# Patient Record
Sex: Male | Born: 1970
Health system: Southern US, Community
[De-identification: ages and names within clinical notes are randomized; demographics above are authoritative.]

## PROBLEM LIST (undated history)

## (undated) DIAGNOSIS — L409 Psoriasis, unspecified: Secondary | ICD-10-CM

## (undated) DIAGNOSIS — E785 Hyperlipidemia, unspecified: Secondary | ICD-10-CM

## (undated) DIAGNOSIS — E781 Pure hyperglyceridemia: Secondary | ICD-10-CM

## (undated) DIAGNOSIS — M109 Gout, unspecified: Secondary | ICD-10-CM

## (undated) DIAGNOSIS — N200 Calculus of kidney: Secondary | ICD-10-CM

## (undated) DIAGNOSIS — R55 Syncope and collapse: Secondary | ICD-10-CM

## (undated) HISTORY — DX: Pure hyperglyceridemia: E78.1

## (undated) HISTORY — DX: Calculus of kidney: N20.0

## (undated) HISTORY — DX: Gout, unspecified: M10.9

## (undated) HISTORY — PX: KIDNEY STONE SURGERY: SHX686

## (undated) HISTORY — DX: Hyperlipidemia, unspecified: E78.5

## (undated) HISTORY — PX: EYE SURGERY: SHX253

## (undated) HISTORY — PX: LITHOTRIPSY: SUR834

---

## 2014-09-09 DIAGNOSIS — D563 Thalassemia minor: Secondary | ICD-10-CM | POA: Insufficient documentation

## 2014-09-09 DIAGNOSIS — Z0000 Encounter for general adult medical examination without abnormal findings: Secondary | ICD-10-CM | POA: Insufficient documentation

## 2015-02-15 ENCOUNTER — Emergency Department (HOSPITAL_COMMUNITY)
Admission: EM | Admit: 2015-02-15 | Discharge: 2015-02-16 | Disposition: A | Discharge status: Home / Self Care | Payer: BLUE CROSS/BLUE SHIELD | Attending: Emergency Medicine | Admitting: Emergency Medicine

## 2015-02-15 ENCOUNTER — Emergency Department (HOSPITAL_COMMUNITY): Payer: BLUE CROSS/BLUE SHIELD

## 2015-02-15 ENCOUNTER — Encounter (HOSPITAL_COMMUNITY): Payer: Self-pay

## 2015-02-15 DIAGNOSIS — Y9289 Other specified places as the place of occurrence of the external cause: Secondary | ICD-10-CM | POA: Insufficient documentation

## 2015-02-15 DIAGNOSIS — Y999 Unspecified external cause status: Secondary | ICD-10-CM | POA: Diagnosis not present

## 2015-02-15 DIAGNOSIS — S01112A Laceration without foreign body of left eyelid and periocular area, initial encounter: Secondary | ICD-10-CM | POA: Diagnosis not present

## 2015-02-15 DIAGNOSIS — R55 Syncope and collapse: Secondary | ICD-10-CM | POA: Diagnosis not present

## 2015-02-15 DIAGNOSIS — W108XXA Fall (on) (from) other stairs and steps, initial encounter: Secondary | ICD-10-CM | POA: Insufficient documentation

## 2015-02-15 DIAGNOSIS — Y939 Activity, unspecified: Secondary | ICD-10-CM | POA: Insufficient documentation

## 2015-02-15 HISTORY — DX: Syncope and collapse: R55

## 2015-02-15 LAB — CBG MONITORING, ED: Glucose-Capillary: 120 mg/dL — ABNORMAL HIGH (ref 70–99)

## 2015-02-15 NOTE — ED Notes (Signed)
Pt arrived by EMS from home after syncopal episode.  Pt was climbing stairs when LOC resulting in fall.  Family states pt was out for about 5 minutes, pt has history of syncope.  Arrived from EMS wearing C-collar, no c/o pain.  Laceration left eyebrow.

## 2015-02-15 NOTE — ED Provider Notes (Signed)
CSN: [id number]     Arrival date & time 02/15/15  2254 History   First MD Initiated Contact with Patient 02/15/15 2256     Chief Complaint  Patient presents with  . Loss of Consciousness  . Fall     (Consider location/radiation/quality/duration/timing/severity/associated sxs/prior Treatment) Patient is a 44 y.o. male presenting with syncope and fall. The history is provided by the patient. No language interpreter was used.  Loss of Consciousness Fall Pertinent negatives include no coughing or numbness.  Oscar Larson is a 44 y.o [place] male who has a history of syncope and presents today for sudden onset syncope while on the wooden stairs in his home.  He states he fell down the stairs, hitting his head on the wooden banister.  He is unsure how many stairs he fell down since he lost consciousness. His family reports that he was unconscious for 5 minutes. This happened to him 2 years ago twice after working out at [employer] and he had a stress test and an echocardiogram done at [facility] in [place] but states everything was normal. He was dizzy before he passed out previously but today he didn't feel dizzy.  He states today is the first time he exerted himself by mowing the entire lawn and it is hilly. He denies any fever, shortness of breath, chest pain, abdominal pain, or extremity pain.   Past Medical History  Diagnosis Date  . Syncope    Past Surgical History  Procedure Laterality Date  . Eye surgery     History reviewed. No pertinent family history. History  Substance Use Topics  . Smoking status: Never Smoker   . Smokeless tobacco: Never Used  . Alcohol Use: No    Review of Systems  Eyes: Negative for visual disturbance.  Respiratory: Negative for cough.   Cardiovascular: Positive for syncope.  Gastrointestinal: Negative for abdominal distention.  Musculoskeletal: Negative for gait problem.  Neurological: Negative for speech difficulty and numbness.  All other  systems reviewed and are negative.     Allergies  Review of patient's allergies indicates no known allergies.  Home Medications   Prior to Admission medications   Not on File   BP 101/53 mmHg  Pulse 80  Temp(Src) 97.6 F (36.4 C) (Oral)  Resp 20  Ht 5\' 7"  (1.702 m)  Wt 193 lb (87.544 kg)  BMI 30.22 kg/m2  SpO2 99% Physical Exam  Constitutional: He is oriented to person, place, and time. He appears well-developed and well-nourished.  HENT:  Head:    2cm laceration through the left eyebrow and 1cm circumferential abraision to the right forehead.   Eyes: Conjunctivae, EOM and lids are normal. Pupils are equal, round, and reactive to light.  Neck: Normal range of motion. Neck supple.  Cardiovascular: Normal rate, regular rhythm and normal heart sounds.   Pulmonary/Chest: Effort normal and breath sounds normal.  Abdominal: Soft. There is no tenderness.  Musculoskeletal: Normal range of motion.  Neurological: He is alert and oriented to person, place, and time. He has normal strength. No cranial nerve deficit or sensory deficit.  Skin: Skin is warm and dry.  Nursing note and vitals reviewed.   ED Course  LACERATION REPAIR Date/Time: 02/16/2015 12:45 AM Performed by: Catha Gosselin Authorized by: Catha Gosselin Consent: Verbal consent obtained. Risks and benefits: risks, benefits and alternatives were discussed Consent given by: patient Patient understanding: patient states understanding of the procedure being performed Imaging studies: imaging studies available Patient identity confirmed: verbally with patient Body  area: head/neck Location details: left eyebrow Laceration length: 2 cm Foreign bodies: no foreign bodies Tendon involvement: none Nerve involvement: none Vascular damage: no Anesthesia: local infiltration Local anesthetic: lidocaine 2% with epinephrine Anesthetic total: 2 ml Patient sedated: no Preparation: Patient was prepped and draped in  the usual sterile fashion. Irrigation solution: saline Irrigation method: syringe Amount of cleaning: standard Debridement: none Degree of undermining: none Skin closure: 6-0 Prolene Number of sutures: 4 Technique: simple Approximation: close Approximation difficulty: simple Dressing: antibiotic ointment Patient tolerance: Patient tolerated the procedure well with no immediate complications   (including critical care time) Labs Review Labs Reviewed  CBC - Abnormal; Notable for the following:    WBC 16.8 (*)    RBC 6.65 (*)    Hemoglobin 12.9 (*)    MCV 60.0 (*)    MCH 19.4 (*)    RDW 15.6 (*)    All other components within normal limits  BASIC METABOLIC PANEL - Abnormal; Notable for the following:    Potassium 3.4 (*)    Glucose, Bld 118 (*)    Creatinine, Ser 1.37 (*)    GFR calc non Af Amer 62 (*)    GFR calc Af Amer 72 (*)    All other components within normal limits  URINALYSIS, ROUTINE W REFLEX MICROSCOPIC - Abnormal; Notable for the following:    Hgb urine dipstick TRACE (*)    All other components within normal limits  URINE MICROSCOPIC-ADD ON - Abnormal; Notable for the following:    Bacteria, UA FEW (*)    All other components within normal limits  CBG MONITORING, ED - Abnormal; Notable for the following:    Glucose-Capillary 120 (*)    All other components within normal limits  CBG MONITORING, ED  Rosezena Sensor, ED    Imaging Review Dg Chest 2 View  02/16/2015   CLINICAL DATA:  Fall, loss of consciousness.  EXAM: CHEST  2 VIEW  COMPARISON:  None.  FINDINGS: The cardiomediastinal contours are normal. Mild eventration of right hemidiaphragm. Pulmonary vasculature is normal. No consolidation, pleural effusion, or pneumothorax. No acute osseous abnormalities are seen.  IMPRESSION: No acute pulmonary process.   Electronically Signed   By: Rubye Oaks M.D.   On: 02/16/2015 00:55   Ct Head Wo Contrast  02/16/2015   CLINICAL DATA:  Syncopal episode while  climbing stairs at home, fall. Loss of consciousness for 5 minutes. LEFT eyebrow laceration.  EXAM: CT HEAD WITHOUT CONTRAST  CT CERVICAL SPINE WITHOUT CONTRAST  TECHNIQUE: Multidetector CT imaging of the head and cervical spine was performed following the standard protocol without intravenous contrast. Multiplanar CT image reconstructions of the cervical spine were also generated.  COMPARISON:  None.  FINDINGS: CT HEAD FINDINGS  The ventricles and sulci are normal. No intraparenchymal hemorrhage, mass effect nor midline shift. No acute large vascular territory infarcts. Very mild white matter changes suspected.  No abnormal extra-axial fluid collections. Basal cisterns are patent.  Focal LEFT periorbital soft tissue swelling without acute fracture. No skull fracture. The included ocular globes and orbital contents are non-suspicious. Bilateral parent ocular lens implants. Paranasal sinus mucosal thickening without air-fluid levels. The mastoid air cells are well aerated.  CT CERVICAL SPINE FINDINGS  Cervical vertebral bodies and posterior elements are intact and aligned with straightened cervical lordosis. Intervertebral disc heights preserved. No destructive bony lesions. C1-2 articulation maintained. Included prevertebral and paraspinal soft tissues are unremarkable.  Degenerative change of the cervical spine results in moderate to severe LEFT C4-5  neural foraminal narrowing.  IMPRESSION: CT HEAD: Focal LEFT periorbital soft tissue swelling without postseptal hematoma nor acute intracranial process.  Mild nonspecific white matter changes.  CT CERVICAL SPINE: Straightened cervical lordosis without acute fracture nor malalignment.   Electronically Signed   By: Awilda Metro   On: 02/16/2015 01:27   Ct Cervical Spine Wo Contrast  02/16/2015   CLINICAL DATA:  Syncopal episode while climbing stairs at home, fall. Loss of consciousness for 5 minutes. LEFT eyebrow laceration.  EXAM: CT HEAD WITHOUT CONTRAST  CT  CERVICAL SPINE WITHOUT CONTRAST  TECHNIQUE: Multidetector CT imaging of the head and cervical spine was performed following the standard protocol without intravenous contrast. Multiplanar CT image reconstructions of the cervical spine were also generated.  COMPARISON:  None.  FINDINGS: CT HEAD FINDINGS  The ventricles and sulci are normal. No intraparenchymal hemorrhage, mass effect nor midline shift. No acute large vascular territory infarcts. Very mild white matter changes suspected.  No abnormal extra-axial fluid collections. Basal cisterns are patent.  Focal LEFT periorbital soft tissue swelling without acute fracture. No skull fracture. The included ocular globes and orbital contents are non-suspicious. Bilateral parent ocular lens implants. Paranasal sinus mucosal thickening without air-fluid levels. The mastoid air cells are well aerated.  CT CERVICAL SPINE FINDINGS  Cervical vertebral bodies and posterior elements are intact and aligned with straightened cervical lordosis. Intervertebral disc heights preserved. No destructive bony lesions. C1-2 articulation maintained. Included prevertebral and paraspinal soft tissues are unremarkable.  Degenerative change of the cervical spine results in moderate to severe LEFT C4-5 neural foraminal narrowing.  IMPRESSION: CT HEAD: Focal LEFT periorbital soft tissue swelling without postseptal hematoma nor acute intracranial process.  Mild nonspecific white matter changes.  CT CERVICAL SPINE: Straightened cervical lordosis without acute fracture nor malalignment.   Electronically Signed   By: Awilda Metro   On: 02/16/2015 01:27     EKG Interpretation   Date/Time:  Monday February 15 2015 22:58:32 EDT Ventricular Rate:  77 PR Interval:  167 QRS Duration: 91 QT Interval:  367 QTC Calculation: 415 R Axis:   41 Text Interpretation:  Sinus rhythm Low voltage, precordial leads RSR' in  V1 or V2, right VCD or RVH No old tracing to compare Confirmed by Mirian Mo [phone]) on 02/15/2015 11:14:10 PM      MDM   Final diagnoses:  Syncope, unspecified syncope type   Patient presents for sudden onset syncope and fall down the stairs.  He had 4-5 minutes of loc and a laceration above the right eyebrow.  He had a similar event twice two years ago.  He had a stress test and echocardiogram at [facility] in [place] which were both normal two years ago.   He is back to baseline with no vision changes, no dizziness, no chest pain, no shortness of breath, no abdominal pain, no nausea, no vomiting.  His CT head only soft tissue swelling without postseptal hematoma or intracranial process.  His CT c-spine shows no acute fracture or malalignment.  His CXR shows no pneumonia, no pleural effusion or pneumothrax.  He has no UTI.  I discussed labs and low hgb and he is aware of this and stated he has thalassemia.  I have given him f/u using the resource guide.  He agrees with the plan and is comfortable going home. He is to have the 4 sutures removed from the left eyebrow in 5 days.         Catha Gosselin, PA-C  02/16/15 1617  Mirian MoMatthew Gentry, MD 02/17/15 [id number]

## 2015-02-16 ENCOUNTER — Emergency Department (HOSPITAL_COMMUNITY): Payer: BLUE CROSS/BLUE SHIELD

## 2015-02-16 LAB — BASIC METABOLIC PANEL
ANION GAP: 7 (ref 5–15)
BUN: 16 mg/dL (ref 6–23)
CHLORIDE: 101 mmol/L (ref 96–112)
CO2: 29 mmol/L (ref 19–32)
CREATININE: 1.37 mg/dL — AB (ref 0.50–1.35)
Calcium: 9 mg/dL (ref 8.4–10.5)
GFR calc non Af Amer: 62 mL/min — ABNORMAL LOW (ref >90)
GFR, EST AFRICAN AMERICAN: 72 mL/min — AB (ref >90)
GLUCOSE: 118 mg/dL — AB (ref 70–99)
Potassium: 3.4 mmol/L — ABNORMAL LOW (ref 3.5–5.1)
SODIUM: 137 mmol/L (ref 135–145)

## 2015-02-16 LAB — CBC
HCT: 39.9 % (ref 39.0–52.0)
HEMOGLOBIN: 12.9 g/dL — AB (ref 13.0–17.0)
MCH: 19.4 pg — ABNORMAL LOW (ref 26.0–34.0)
MCHC: 32.3 g/dL (ref 30.0–36.0)
MCV: 60 fL — ABNORMAL LOW (ref 78.0–100.0)
Platelets: 221 10*3/uL (ref 150–400)
RBC: 6.65 MIL/uL — AB (ref 4.22–5.81)
RDW: 15.6 % — ABNORMAL HIGH (ref 11.5–15.5)
WBC: 16.8 10*3/uL — AB (ref 4.0–10.5)

## 2015-02-16 LAB — URINALYSIS, ROUTINE W REFLEX MICROSCOPIC
Bilirubin Urine: NEGATIVE
Glucose, UA: NEGATIVE mg/dL
Ketones, ur: NEGATIVE mg/dL
LEUKOCYTES UA: NEGATIVE
NITRITE: NEGATIVE
Protein, ur: NEGATIVE mg/dL
SPECIFIC GRAVITY, URINE: 1.016 (ref 1.005–1.030)
Urobilinogen, UA: 0.2 mg/dL (ref 0.0–1.0)
pH: 6 (ref 5.0–8.0)

## 2015-02-16 LAB — URINE MICROSCOPIC-ADD ON

## 2015-02-16 LAB — I-STAT TROPONIN, ED: TROPONIN I, POC: 0 ng/mL (ref 0.00–0.08)

## 2015-02-16 MED ORDER — LIDOCAINE-EPINEPHRINE (PF) 2 %-1:200000 IJ SOLN
10.0000 mL | Freq: Once | INTRAMUSCULAR | Status: AC
  Administered 2015-02-16: 10 mL via INTRADERMAL
  Filled 2015-02-16: qty 20

## 2015-02-16 NOTE — Discharge Instructions (Signed)
Syncope °Syncope is a medical term for fainting or passing out. This means you lose consciousness and drop to the ground. People are generally unconscious for less than 5 minutes. You may have some muscle twitches for up to 15 seconds before waking up and returning to normal. Syncope occurs more often in older adults, but it can happen to anyone. While most causes of syncope are not dangerous, syncope can be a sign of a serious medical problem. It is important to seek medical care.  °CAUSES  °Syncope is caused by a sudden drop in blood flow to the brain. The specific cause is often not determined. Factors that can bring on syncope include: °· Taking medicines that lower blood pressure. °· Sudden changes in posture, such as standing up quickly. °· Taking more medicine than prescribed. °· Standing in one place for too long. °· Seizure disorders. °· Dehydration and excessive exposure to heat. °· Low blood sugar (hypoglycemia). °· Straining to have a bowel movement. °· Heart disease, irregular heartbeat, or other circulatory problems. °· Fear, emotional distress, seeing blood, or severe pain. °SYMPTOMS  °Right before fainting, you may: °· Feel dizzy or light-headed. °· Feel nauseous. °· See all white or all black in your field of vision. °· Have cold, clammy skin. °DIAGNOSIS  °Your health care provider will ask about your symptoms, perform a physical exam, and perform an electrocardiogram (ECG) to record the electrical activity of your heart. Your health care provider may also perform other heart or blood tests to determine the cause of your syncope which may include: °· Transthoracic echocardiogram (TTE). During echocardiography, sound waves are used to evaluate how blood flows through your heart. °· Transesophageal echocardiogram (TEE). °· Cardiac monitoring. This allows your health care provider to monitor your heart rate and rhythm in real time. °· Holter monitor. This is a portable device that records your  heartbeat and can help diagnose heart arrhythmias. It allows your health care provider to track your heart activity for several days, if needed. °· Stress tests by exercise or by giving medicine that makes the heart beat faster. °TREATMENT  °In most cases, no treatment is needed. Depending on the cause of your syncope, your health care provider may recommend changing or stopping some of your medicines. °HOME CARE INSTRUCTIONS °· Have someone stay with you until you feel stable. °· Do not drive, use machinery, or play sports until your health care provider says it is okay. °· Keep all follow-up appointments as directed by your health care provider. °· Lie down right away if you start feeling like you might faint. Breathe deeply and steadily. Wait until all the symptoms have passed. °· Drink enough fluids to keep your urine clear or pale yellow. °· If you are taking blood pressure or heart medicine, get up slowly and take several minutes to sit and then stand. This can reduce dizziness. °SEEK IMMEDIATE MEDICAL CARE IF:  °· You have a severe headache. °· You have unusual pain in the chest, abdomen, or back. °· You are bleeding from your mouth or rectum, or you have black or tarry stool. °· You have an irregular or very fast heartbeat. °· You have pain with breathing. °· You have repeated fainting or seizure-like jerking during an episode. °· You faint when sitting or lying down. °· You have confusion. °· You have trouble walking. °· You have severe weakness. °· You have vision problems. °If you fainted, call your local emergency services (911 in [place] Do not drive   yourself to the hospital.  °MAKE SURE YOU: °· Understand these instructions. °· Will watch your condition. °· Will get help right away if you are not doing well or get worse. °Document Released: 11/06/2005 Document Revised: 11/11/2013 Document Reviewed: 01/05/2012 °ExitCare® Patient Information ©2015 [employer]. This information is not intended to replace  advice given to you by your health care provider. Make sure you discuss any questions you have with your health care provider. ° °Emergency Department Resource Guide °1) Find a Doctor and Pay Out of Pocket °Although you won't have to find out who is covered by your insurance plan, it is a good idea to ask around and get recommendations. You will then need to call the office and see if the doctor you have chosen will accept you as a new patient and what types of options they offer for patients who are self-pay. Some doctors offer discounts or will set up payment plans for their patients who do not have insurance, but you will need to ask so you aren't surprised when you get to your appointment. ° °2) Contact Your Local Health Department °Not all health departments have doctors that can see patients for sick visits, but many do, so it is worth a call to see if yours does. If you don't know where your local health department is, you can check in your phone book. The CDC also has a tool to help you locate your state's health department, and many state websites also have listings of all of their local health departments. ° °3) Find a Walk-in Clinic °If your illness is not likely to be very severe or complicated, you may want to try a walk in clinic. These are popping up all over the country in pharmacies, drugstores, and shopping centers. They're usually staffed by nurse practitioners or physician assistants that have been trained to treat common illnesses and complaints. They're usually fairly quick and inexpensive. However, if you have serious medical issues or chronic medical problems, these are probably not your best option. ° °No Primary Care Doctor: °- Call [facility] at  832-8000 - they can help you locate a primary care doctor that  accepts your insurance, provides certain services, etc. °- Physician Referral Service- 1-800-533-3463 ° °Chronic Pain Problems: °Organization         Address  Phone    Notes  °[facility]  (336) 297-2271 Patients need to be referred by their primary care doctor.  ° °Medication Assistance: °Organization         Address  Phone   Notes  °[place] [facility] [address] °[place],  [address] (336) 641-8030 --Must be a resident of [place] °-- Must have NO insurance coverage whatsoever (no Medicaid/ Medicare, etc.) °-- The pt. MUST have a primary care doctor that directs their care regularly and follows them in the community °  °[facility]  (866) 331-1348   °[employer]  (888) 892-1162   ° °Agencies that provide inexpensive medical care: °Organization         Address  Phone   Notes  °[facility]  (336) 832-8035   °[facility]    (336) 832-7272   °[facility] [address] °[place],  [address] (336) 832-4777   °[facility] [address], °[place] (336) 271-4999   °[facility]    (336) 373-0678   °[facility]    (336) 272-1050   °  [facility] ° [address], [place] Phone:  (336) 832-4444, Fax:  (336) 832-4440 Hours of Operation:  9 am - 6 pm, M-F.  Also accepts Medicaid/Medicare and self-pay.  °[facility] ° [address], [place] Phone: (336) 832-3150, Fax: (336) [phone]. Hours of Operation:  8:30 am - 5:30 pm, M-F.  Also accepts Medicaid and self-pay.  °[facility] [place] [address], [place] Phone: (336) 878-6027   °[facility] [address], [place], [place] (336)723-1848, Ext. 123 Mondays & Thursdays: 7-9 AM.  First 15 patients are seen on a first come, first serve basis. °  ° °Medicaid-accepting [place] Providers: ° °Organization         Address  Phone   Notes  °[facility] [address], [place] (336) 641-2100 Also accepts self-pay patients.  °[facility]  [address], [place] ° (336) 856-9996   °[facility] [address], [place] (336) 288-8857   °[facility] [address] [place] [address], [place] (336) 299-7000   °Veita Bland [address], [place]  ° (336) 373-1557 Only accepts [place] Access Medicaid patients after they have their name applied to their card.  ° °Self-Pay (no insurance) in [place]: ° °Organization         Address  Phone   Notes  °Sickle Cell Patients, [facility] [address], [place] (336) 832-1970   °[facility] [address], [place] (336) 832-4400   °[facility] [place] ° [address], [place] (336) 992-4800   °[facility]/Dr. Osei-Bonsu ° [address] [place] [address], [place] or [address], [place] (336) [phone] Phone number for both [place] and [place] locations is the same.  °[facility] [address], [place] (336) 299-0000   °[facility] [address] [place] [address], [place] or [address] (336) 852-7530 °(336) 878-2260   °[facility] [address], [place] (336) 350-1642, phone; (336) 294-5005, fax Sees patients 1st and 3rd Saturday of every month.  Must not qualify for public or private insurance (i.e. Medicaid, Medicare, [place] Health Choice, Veterans' Benefits) • Household income should be no more than 200% of the poverty level •The clinic cannot treat you if you are pregnant or think you are pregnant • Sexually transmitted diseases are not treated at the clinic.  ° ° °Dental Care: °Organization         Address  Phone  Notes  °[facility] [address],  (336) 641-6152 Accepts children up to age 21 who are enrolled in Medicaid or [place] Health Choice; pregnant women with a Medicaid card; and children who have  applied for Medicaid or [place] Health Choice, but were declined, whose parents can pay a reduced fee at time of service.  °[facility] [place]  [address], [place] (336) 641-7733 Accepts children up to age 21 who are enrolled in Medicaid or [place] Health Choice; pregnant women with a Medicaid card; and children who have applied for Medicaid or [place] Health Choice, but were declined, whose parents can pay a reduced fee at time of service.  °[facility] ° [address], [place] (336) 641-4533 Patients are seen by appointment only. Walk-ins are not accepted. [facility] will see patients 18 years of age and older. °Monday - Tuesday (8am-5pm) °Most Wednesdays (8:30-5pm) °$30 per visit, cash only  °[facility] ° [address], [place] (336) 641-4533 Patients are seen by appointment only. Walk-ins are not accepted. [facility] will see patients 18 years of age and older. °One Wednesday Evening (Monthly: Volunteer Based).  $30 per visit, cash only  °[facility]  (919) 537-3737 for adults; Children under age 4, call Graduate Pediatric Dentistry at ([phone]) [phone]. Children aged 4-14, please call (919) [phone] to request a pediatric application. ° Dental services are provided in all areas of dental care including fillings, crowns and bridges, complete and partial dentures, implants, gum treatment, root canals, and extractions. Preventive care is also provided. Treatment is provided to both adults and children. °Patients are selected via a lottery and there is often a waiting list. °  °[facility] [address], °[place] ° (336) 763-8833 [email or web address] °  °[facility] [address], [place], [place] (336)723-1848, Ext. 123 Second and Fourth Thursday of each month, opens at 6:30 AM; Clinic ends at 9 AM.  Patients are seen on a first-come first-served basis, and a  limited number are seen during each clinic.  ° °[facility] ° [address], [place], [place] (336) 723-7904   Eligibility Requirements °You must have lived in [place], or [place] counties for at least the last three months. °  You cannot be eligible for state or federal sponsored healthcare insurance, including Veterans Administration, Medicaid, or Medicare. °  You generally cannot be eligible for healthcare insurance through your employer.  °  How to apply: °Eligibility screenings are held every Tuesday and Wednesday afternoon from 1:00 pm until 4:00 pm. You do not need an appointment for the interview!  °[facility] [address], [place], [place] 336-631-2330   °[facility]  336-342-8273   °[facility]  336-703-3100   °[facility]  336-570-6415   ° °Behavioral Health Resources in the Community: °Intensive Outpatient Programs °Organization         Address  Phone  Notes  °[facility] [address], [place], [place] 336-878-6098   °[facility] [address], [place], [place] 336-832-9800   °[facility] [address], [place], [place] ° 336-882-2125   °[facility] [address],  °[place], [place] 1-800-853-5163 or 336-641-4981   °Substance Abuse Resources °Organization         Address  Phone  Notes  °[facility]  336-882-2125   °[facility]  336-784-9470   °[facility]  336-285-9073   °[facility]  336-845-3988   °Residential & Outpatient Substance Abuse Program  1-800-659-3381   °Psychological Services °Organization         Address  Phone  Notes  °[facility]  336- 832-9600   °[facility]  336- 378-7881   °[facility] [address], [place] 1-800-853-5163 or 336-641-4981   ° °Mobile Crisis Teams °Organization          Address  Phone  Notes  °[facility], Mobile Crisis Care Unit  1-877-626-1772   °Assertive °[facility] ° [address] [place], [place] 336-834-9664   °  Sharon DeEsch [address] °[place] 336-554-5454   ° °Self-Help/Support Groups °Organization         Address  Phone             Notes  °[facility] - variety of support groups  336- 373-1402 Call for more information  °Narcotics Anonymous (NA), [facility] [address], °[place]  2 meetings at this location  ° °Residential Treatment Programs °Organization         Address  Phone  Notes  °[facility] [address],    °[place]  1-866-801-8205   °[facility] ° [address], [place], [place] 704-293-8524   °[facility] [address], [place] 336-845-3988 Admissions: 8am-3pm M-F  °[facility] [address]    °[place], [place] 336-841-1104   °[facility] [address], [place], [place] 336-379-7146   °[facility] [address]  °[place], [place] 336-285-9073   °[facility] - Intensive Outpatient [address], [place], [place] 336-852-3033   °[facility] [address]  °[place], [place] 1-877-615-2722 or 336-784-9470   °[facility] [address] [place], [place] 336-227-7417 Accepts Medicaid  °[facility] [address]  ° [place] 1-800-659-3381 Substance Abuse/Addiction Treatment  ° °[place] Behavioral Health Resources °Organization         Address  Phone  Notes  °[facility]  (888) 581-9988   °Julie Brannon, PhD [address] [place], [place]   (336) 349-5553 or (336) 951-0000   °[facility]   [address] °[place], [place] (336) 349-4454   °[facility] [address], [place], [place] (336) 342-8316 Insurance/Medicaid/sponsorship  through [facility]  °[facility] [address]                                    [place], [place] (336) 342-8316 Therapy/tele-psych/case  °[facility] [address]  ° [place], [place] (336) 349-2233    °Dr. Arfeen  (336) 349-4544   °[facility] [place]  [employer] [place] [facility] 1) [address], [place] °2) [address], [place] °3)  [address], [place] (336) 349-3220 °(336) 342-7768 ° °(336) 342-8140   °[place] [facility] (336) 342-1394 or (336) 342-3537 (After Hours)    ° ° ° °

## 2015-02-16 NOTE — ED Notes (Signed)
Pt stable, ambulatory, denies any pain, wife at bedside.

## 2015-02-22 DIAGNOSIS — E876 Hypokalemia: Secondary | ICD-10-CM | POA: Insufficient documentation

## 2015-02-22 DIAGNOSIS — M542 Cervicalgia: Secondary | ICD-10-CM | POA: Insufficient documentation

## 2015-02-22 DIAGNOSIS — R55 Syncope and collapse: Secondary | ICD-10-CM | POA: Insufficient documentation

## 2015-10-12 IMAGING — CT CT HEAD W/O CM
4 of 6 series · 17 of 47 positions shown, 19 images · non-contrast
Comparison: None.

CLINICAL DATA: Syncopal episode while climbing stairs at home,
fall. Loss of consciousness for 5 minutes. LEFT eyebrow laceration.

EXAM:
CT HEAD WITHOUT CONTRAST
CT CERVICAL SPINE WITHOUT CONTRAST
TECHNIQUE: Multidetector CT imaging of the head and cervical spine was
performed following the standard protocol without intravenous
contrast. Multiplanar CT image reconstructions of the cervical spine
were also generated.

[Series 202: head w/o bone, idose (1) · axial · non-contrast · 0.49mm/px · z∈[+268,+321]mm · 3 of 64 slices shown]
[im 11/64  bone]
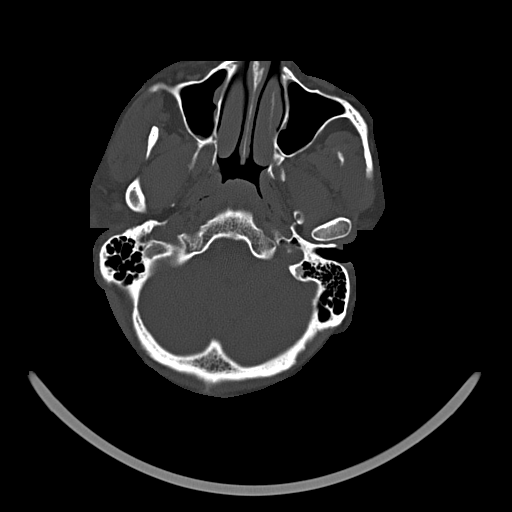
[im 22/64  bone]
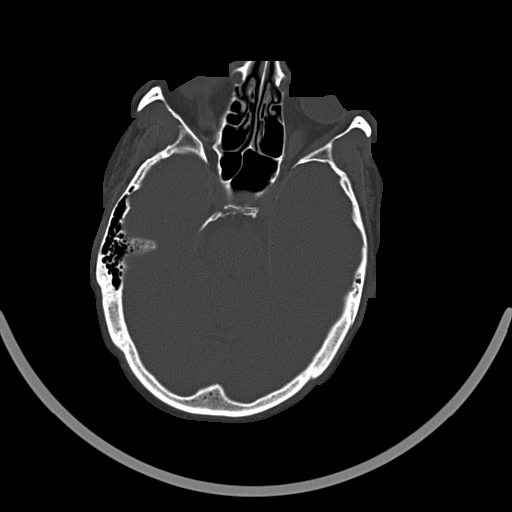
[im 32/64  bone]
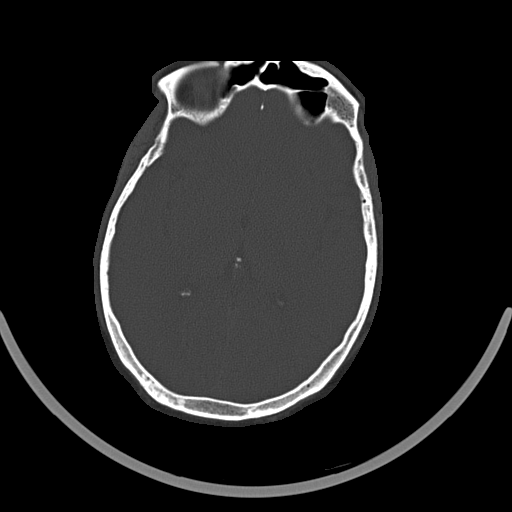

[Series 302: soft tissue, idose (2) · axial · 0.39mm/px · z∈[+123,+273]mm · 8 of 97 slices shown, 10 images]
[im 11/97  brain]
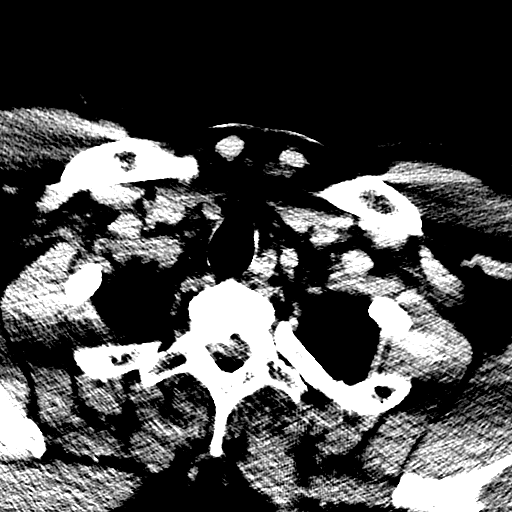
[im 11/97  bone]
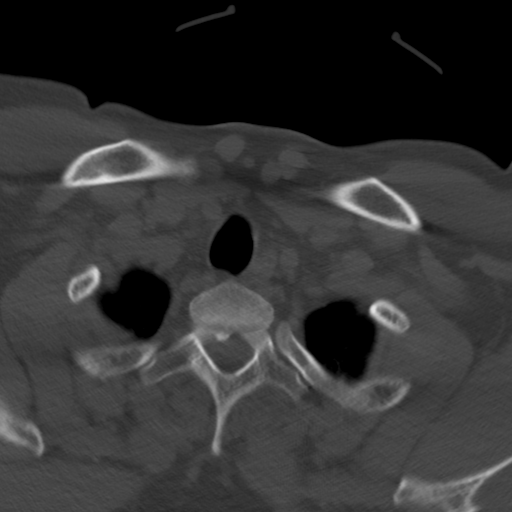
[im 22/97  brain]
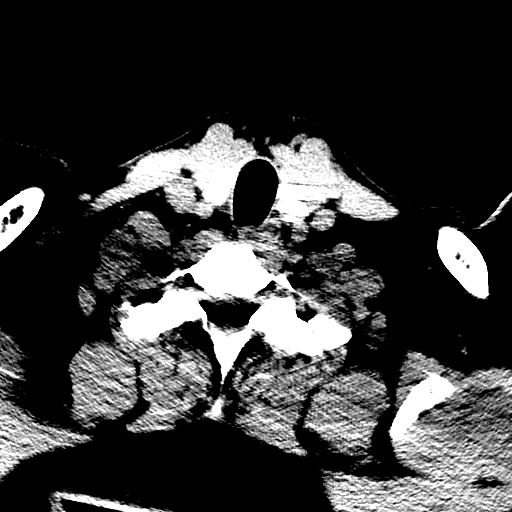
[im 33/97  brain]
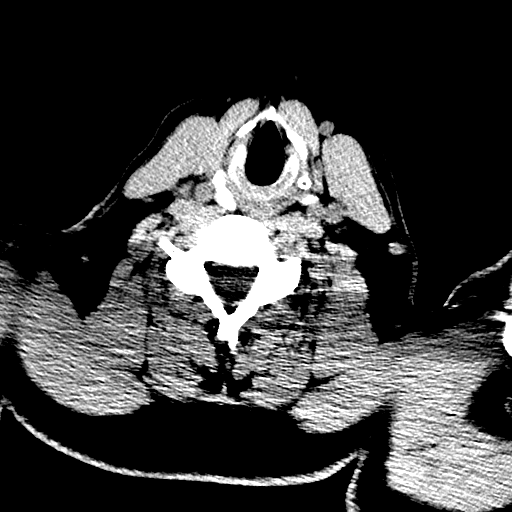
[im 43/97  brain]
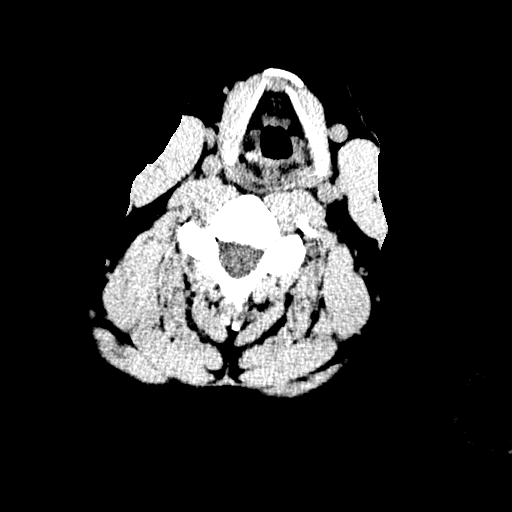
[im 54/97  brain]
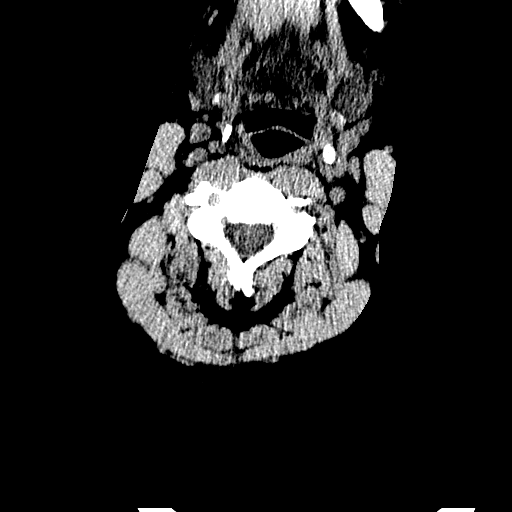
[im 54/97  bone]
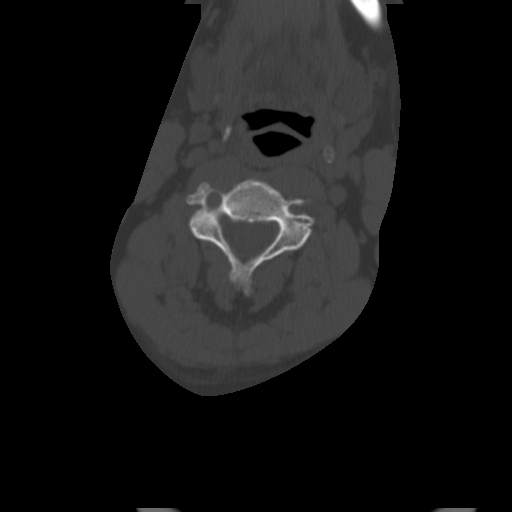
[im 65/97  brain]
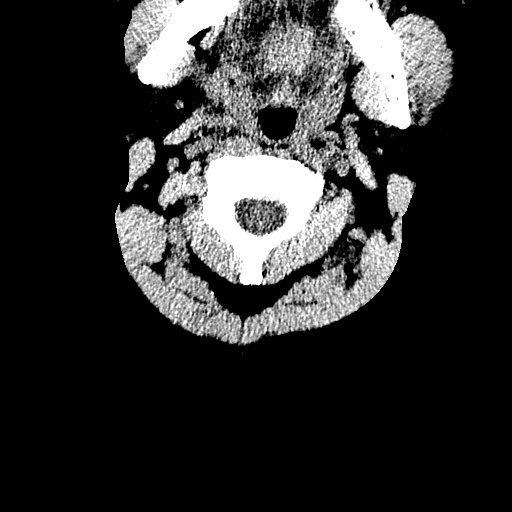
[im 75/97  brain]
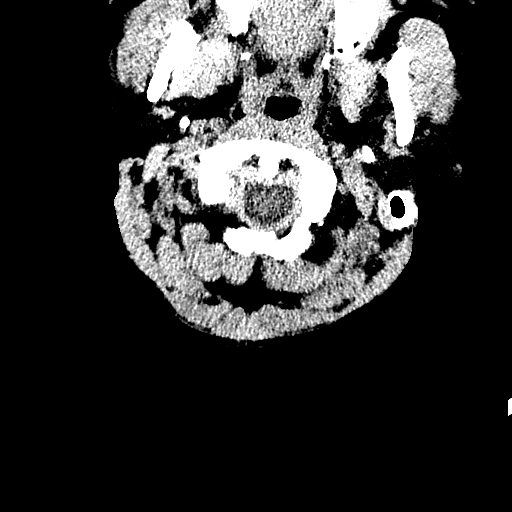
[im 86/97  brain]
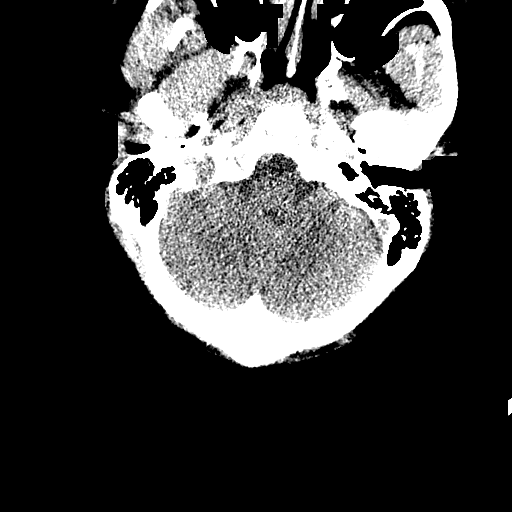

[Series 304: coronal, idose (2) · coronal · 0.35mm/px · 3 of 62 slices shown]
[im 21/62  brain]
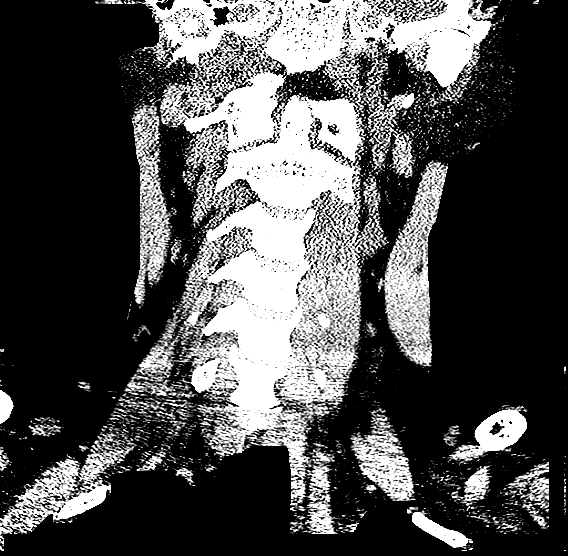
[im 28/62  brain]
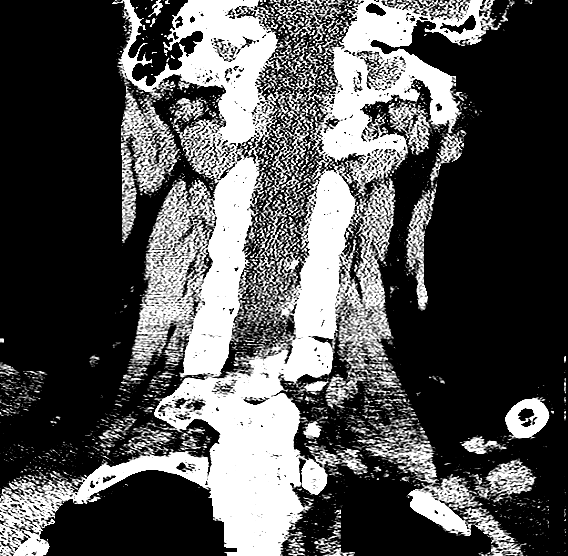
[im 34/62  brain]
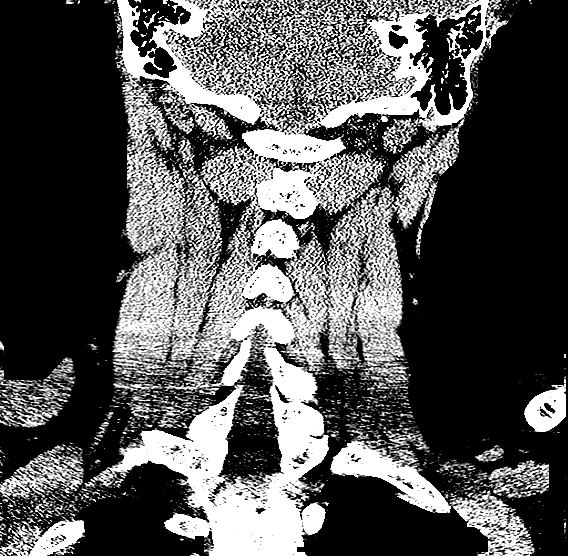

[Series 305: sagittal, idose (2) · sagittal · 0.34mm/px · 3 of 63 slices shown]
[im 21/63  brain]
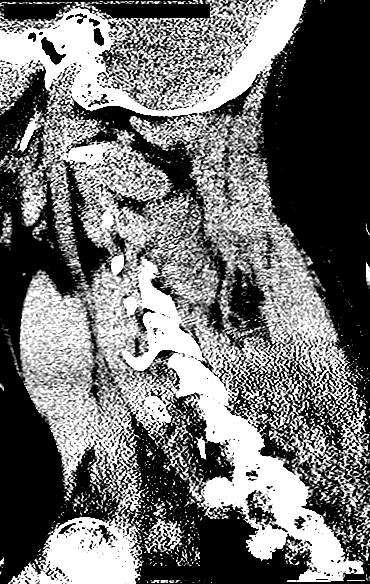
[im 32/63  brain]
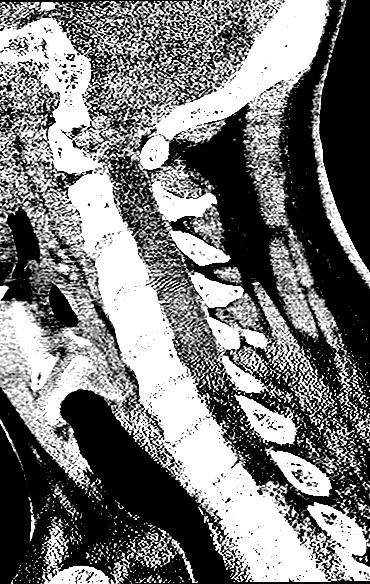
[im 42/63  brain]
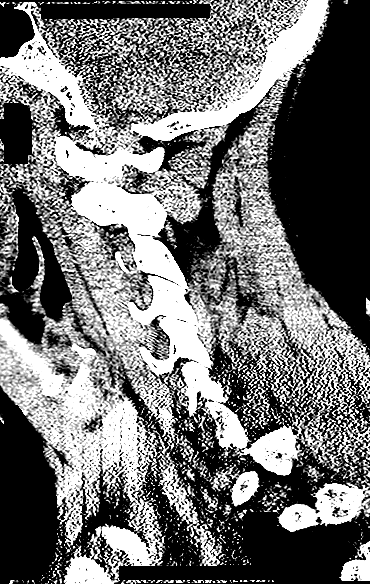

[17 of 47 positions shown; findings below may reference images not displayed]

FINDINGS: CT HEAD FINDINGS

The ventricles and sulci are normal. No intraparenchymal hemorrhage,
mass effect nor midline shift. No acute large vascular territory
infarcts. Very mild white matter changes suspected.

No abnormal extra-axial fluid collections. Basal cisterns are
patent.

Focal LEFT periorbital soft tissue swelling without acute fracture.
No skull fracture. The included ocular globes and orbital contents
are non-suspicious. Bilateral parent ocular lens implants. Paranasal
sinus mucosal thickening without air-fluid levels. The mastoid air
cells are well aerated.

CT CERVICAL SPINE FINDINGS

Cervical vertebral bodies and posterior elements are intact and
aligned with straightened cervical lordosis. Intervertebral disc
heights preserved. No destructive bony lesions. C1-2 articulation
maintained. Included prevertebral and paraspinal soft tissues are
unremarkable.

Degenerative change of the cervical spine results in moderate to
severe LEFT C4-5 neural foraminal narrowing.
IMPRESSION: CT HEAD: Focal LEFT periorbital soft tissue swelling without
postseptal hematoma nor acute intracranial process.

Mild nonspecific white matter changes.

CT CERVICAL SPINE: Straightened cervical lordosis without acute
fracture nor malalignment.

  By: Chai Tiger

## 2016-06-08 DIAGNOSIS — K122 Cellulitis and abscess of mouth: Secondary | ICD-10-CM | POA: Insufficient documentation

## 2016-11-27 DIAGNOSIS — K760 Fatty (change of) liver, not elsewhere classified: Secondary | ICD-10-CM | POA: Insufficient documentation

## 2016-11-27 DIAGNOSIS — E781 Pure hyperglyceridemia: Secondary | ICD-10-CM | POA: Insufficient documentation

## 2017-05-14 DIAGNOSIS — M109 Gout, unspecified: Secondary | ICD-10-CM | POA: Insufficient documentation

## 2020-06-24 DIAGNOSIS — Z800 Family history of malignant neoplasm of digestive organs: Secondary | ICD-10-CM | POA: Insufficient documentation

## 2021-07-20 DIAGNOSIS — L409 Psoriasis, unspecified: Secondary | ICD-10-CM | POA: Insufficient documentation

## 2021-10-18 DIAGNOSIS — R200 Anesthesia of skin: Secondary | ICD-10-CM | POA: Insufficient documentation

## 2021-10-18 DIAGNOSIS — M5416 Radiculopathy, lumbar region: Secondary | ICD-10-CM | POA: Insufficient documentation

## 2022-11-07 DIAGNOSIS — Z5181 Encounter for therapeutic drug level monitoring: Secondary | ICD-10-CM | POA: Diagnosis not present

## 2022-11-07 DIAGNOSIS — L409 Psoriasis, unspecified: Secondary | ICD-10-CM | POA: Diagnosis not present

## 2022-11-30 DIAGNOSIS — J159 Unspecified bacterial pneumonia: Secondary | ICD-10-CM | POA: Diagnosis not present

## 2022-11-30 DIAGNOSIS — R509 Fever, unspecified: Secondary | ICD-10-CM | POA: Diagnosis not present

## 2022-11-30 DIAGNOSIS — Z1152 Encounter for screening for COVID-19: Secondary | ICD-10-CM | POA: Diagnosis not present

## 2022-12-08 DIAGNOSIS — R059 Cough, unspecified: Secondary | ICD-10-CM | POA: Diagnosis not present

## 2022-12-08 DIAGNOSIS — J40 Bronchitis, not specified as acute or chronic: Secondary | ICD-10-CM | POA: Diagnosis not present

## 2022-12-14 DIAGNOSIS — H527 Unspecified disorder of refraction: Secondary | ICD-10-CM | POA: Diagnosis not present

## 2022-12-14 DIAGNOSIS — H40003 Preglaucoma, unspecified, bilateral: Secondary | ICD-10-CM | POA: Diagnosis not present

## 2022-12-15 DIAGNOSIS — R5383 Other fatigue: Secondary | ICD-10-CM | POA: Diagnosis not present

## 2022-12-15 DIAGNOSIS — J209 Acute bronchitis, unspecified: Secondary | ICD-10-CM | POA: Diagnosis not present

## 2022-12-15 DIAGNOSIS — J302 Other seasonal allergic rhinitis: Secondary | ICD-10-CM | POA: Diagnosis not present

## 2022-12-15 DIAGNOSIS — D563 Thalassemia minor: Secondary | ICD-10-CM | POA: Diagnosis not present

## 2023-03-31 DIAGNOSIS — J988 Other specified respiratory disorders: Secondary | ICD-10-CM | POA: Diagnosis not present

## 2023-03-31 DIAGNOSIS — J40 Bronchitis, not specified as acute or chronic: Secondary | ICD-10-CM | POA: Diagnosis not present

## 2023-05-02 ENCOUNTER — Ambulatory Visit (INDEPENDENT_AMBULATORY_CARE_PROVIDER_SITE_OTHER): Payer: BC Managed Care – PPO | Admitting: Urology

## 2023-05-02 ENCOUNTER — Encounter: Payer: Self-pay | Admitting: Urology

## 2023-05-02 VITALS — BP 134/87 | HR 75 | Ht 67.0 in | Wt 220.0 lb

## 2023-05-02 DIAGNOSIS — R109 Unspecified abdominal pain: Secondary | ICD-10-CM | POA: Diagnosis not present

## 2023-05-02 DIAGNOSIS — N200 Calculus of kidney: Secondary | ICD-10-CM

## 2023-05-02 NOTE — Progress Notes (Signed)
Assessment: 1. Nephrolithiasis   2. Flank pain     Plan: I personally reviewed the patient's chart including provider notes, lab results. CT renal stone protocol next available - will call with results Stone sent for analysis.  Chief Complaint:  Chief Complaint  Patient presents with   Nephrolithiasis    History of Present Illness:  Oscar Larson is a 52 y.o. male who is seen  for evaluation of nephrolithiasis.   He was previously seen in 2017 for a left ureteral calculus.  CT imaging at that time showed a 6 x 10 mm calculus in the left proximal ureter with associated hydronephrosis and a 4 mm nonobstructing right upper pole renal calculus.  He underwent left ESL in October 2016 passing stone fragments following the procedure.  Follow-up KUB showed fragments in the proximal left ureter and a possible fragment in the left distal ureter.  A repeat ESL was performed in December 2016.  He again passed small fragments following that treatment.  Stone analysis showed calcium oxalate 95% and calcium phosphate 5%.  He had persistence of fragments in the ureter and subsequently underwent left ureteroscopy with holmium laser lithotripsy in 1/17.  He was last seen in March 2017. He presents today with a 79-month history of left flank pain, dysuria, urgency, and intermittent gross hematuria.  No nausea or vomiting.  He reports passing a stone earlier today.  No recent imaging studies.  Past Medical History:  Past Medical History:  Diagnosis Date   Kidney stone    Syncope     Past Surgical History:  Past Surgical History:  Procedure Laterality Date   EYE SURGERY     KIDNEY STONE SURGERY     LITHOTRIPSY      Allergies:  No Known Allergies  Family History:  History reviewed. No pertinent family history.  Social History:  Social History   Tobacco Use   Smoking status: Never   Smokeless tobacco: Never  Substance Use Topics   Alcohol use: No   Drug use: No    Review  of symptoms:  Constitutional:  Negative for unexplained weight loss, night sweats, fever, chills ENT:  Negative for nose bleeds, sinus pain, painful swallowing CV:  Negative for chest pain, shortness of breath, exercise intolerance, palpitations, loss of consciousness Resp:  Negative for cough, wheezing, shortness of breath GI:  Negative for nausea, vomiting, diarrhea, bloody stools GU:  Positives noted in HPI; otherwise negative for urinary incontinence Neuro:  Negative for seizures, poor balance, limb weakness, slurred speech Psych:  Negative for lack of energy, depression, anxiety Endocrine:  Negative for polydipsia, polyuria, symptoms of hypoglycemia (dizziness, hunger, sweating) Hematologic:  Negative for anemia, purpura, petechia, prolonged or excessive bleeding, use of anticoagulants  Allergic:  Negative for difficulty breathing or choking as a result of exposure to anything; no shellfish allergy; no allergic response (rash/itch) to materials, foods  Physical exam: BP 134/87   Pulse 75   Ht 5\' 7"  (1.702 m)   Wt 220 lb (99.8 kg)   BMI 34.46 kg/m  GENERAL APPEARANCE:  Well appearing, well developed, well nourished, NAD HEENT: Atraumatic, Normocephalic, oropharynx clear. NECK: Supple without lymphadenopathy or thyromegaly. LUNGS: Clear to auscultation bilaterally. HEART: Regular Rate and Rhythm without murmurs, gallops, or rubs. ABDOMEN: Soft, non-tender, No Masses. EXTREMITIES: Moves all extremities well.  Without clubbing, cyanosis, or edema. NEUROLOGIC:  Alert and oriented x 3, normal gait, CN II-XII grossly intact.  MENTAL STATUS:  Appropriate. BACK:  Non-tender to palpation.  No CVAT SKIN:  Warm, dry and intact.    Results: U/A:  0-5 WBC, 0-2 RBC, mod bacteria

## 2023-05-03 DIAGNOSIS — N200 Calculus of kidney: Secondary | ICD-10-CM | POA: Diagnosis not present

## 2023-05-07 ENCOUNTER — Encounter: Payer: Self-pay | Admitting: Urology

## 2023-05-09 LAB — MICROSCOPIC EXAMINATION
Cast Type: NONE SEEN
Casts: NONE SEEN /lpf
Crystal Type: NONE SEEN
Crystals: NONE SEEN
Renal Epithel, UA: NONE SEEN /hpf
Trichomonas, UA: NONE SEEN
Yeast, UA: NONE SEEN

## 2023-05-09 LAB — URINALYSIS, ROUTINE W REFLEX MICROSCOPIC
Bilirubin, UA: NEGATIVE
Glucose, UA: NEGATIVE
Ketones, UA: NEGATIVE
Leukocytes,UA: NEGATIVE
Nitrite, UA: NEGATIVE
Protein,UA: NEGATIVE
Specific Gravity, UA: 1.03 (ref 1.005–1.030)
Urobilinogen, Ur: 0.2 mg/dL (ref 0.2–1.0)
pH, UA: 5.5 (ref 5.0–7.5)

## 2023-05-10 ENCOUNTER — Encounter: Payer: BC Managed Care – PPO | Admitting: Urology

## 2023-05-17 ENCOUNTER — Ambulatory Visit (HOSPITAL_BASED_OUTPATIENT_CLINIC_OR_DEPARTMENT_OTHER)
Admission: RE | Admit: 2023-05-17 | Discharge: 2023-05-17 | Disposition: A | Discharge status: Home / Self Care | Payer: BC Managed Care – PPO | Source: Ambulatory Visit | Attending: Urology | Admitting: Urology

## 2023-05-17 DIAGNOSIS — R109 Unspecified abdominal pain: Secondary | ICD-10-CM | POA: Insufficient documentation

## 2023-05-17 DIAGNOSIS — R10A0 Flank pain, unspecified side: Secondary | ICD-10-CM

## 2023-05-17 DIAGNOSIS — N200 Calculus of kidney: Secondary | ICD-10-CM

## 2023-05-17 DIAGNOSIS — K760 Fatty (change of) liver, not elsewhere classified: Secondary | ICD-10-CM | POA: Diagnosis not present

## 2023-05-17 DIAGNOSIS — K5730 Diverticulosis of large intestine without perforation or abscess without bleeding: Secondary | ICD-10-CM | POA: Diagnosis not present

## 2023-05-21 ENCOUNTER — Encounter: Payer: Self-pay | Admitting: Urology

## 2023-07-05 DIAGNOSIS — H526 Other disorders of refraction: Secondary | ICD-10-CM | POA: Diagnosis not present

## 2023-07-05 DIAGNOSIS — H35413 Lattice degeneration of retina, bilateral: Secondary | ICD-10-CM | POA: Diagnosis not present

## 2023-07-05 DIAGNOSIS — H31003 Unspecified chorioretinal scars, bilateral: Secondary | ICD-10-CM | POA: Diagnosis not present

## 2023-07-05 DIAGNOSIS — H40013 Open angle with borderline findings, low risk, bilateral: Secondary | ICD-10-CM | POA: Diagnosis not present

## 2023-07-05 DIAGNOSIS — H43813 Vitreous degeneration, bilateral: Secondary | ICD-10-CM | POA: Diagnosis not present

## 2023-08-13 DIAGNOSIS — H16223 Keratoconjunctivitis sicca, not specified as Sjogren's, bilateral: Secondary | ICD-10-CM | POA: Diagnosis not present

## 2023-08-13 DIAGNOSIS — H40013 Open angle with borderline findings, low risk, bilateral: Secondary | ICD-10-CM | POA: Diagnosis not present

## 2023-08-13 DIAGNOSIS — H35413 Lattice degeneration of retina, bilateral: Secondary | ICD-10-CM | POA: Diagnosis not present

## 2023-08-13 DIAGNOSIS — H43813 Vitreous degeneration, bilateral: Secondary | ICD-10-CM | POA: Diagnosis not present

## 2023-09-26 DIAGNOSIS — Z13220 Encounter for screening for lipoid disorders: Secondary | ICD-10-CM | POA: Diagnosis not present

## 2023-09-26 DIAGNOSIS — M25562 Pain in left knee: Secondary | ICD-10-CM | POA: Diagnosis not present

## 2023-09-26 DIAGNOSIS — Z0000 Encounter for general adult medical examination without abnormal findings: Secondary | ICD-10-CM | POA: Diagnosis not present

## 2023-09-26 DIAGNOSIS — D563 Thalassemia minor: Secondary | ICD-10-CM | POA: Diagnosis not present

## 2023-09-26 DIAGNOSIS — M5416 Radiculopathy, lumbar region: Secondary | ICD-10-CM | POA: Diagnosis not present

## 2023-09-26 DIAGNOSIS — G8929 Other chronic pain: Secondary | ICD-10-CM | POA: Diagnosis not present

## 2023-09-26 DIAGNOSIS — E781 Pure hyperglyceridemia: Secondary | ICD-10-CM | POA: Diagnosis not present

## 2023-09-26 DIAGNOSIS — Z23 Encounter for immunization: Secondary | ICD-10-CM | POA: Diagnosis not present

## 2023-11-02 DIAGNOSIS — L409 Psoriasis, unspecified: Secondary | ICD-10-CM | POA: Diagnosis not present

## 2023-11-02 DIAGNOSIS — Z5181 Encounter for therapeutic drug level monitoring: Secondary | ICD-10-CM | POA: Diagnosis not present

## 2023-12-26 DIAGNOSIS — R200 Anesthesia of skin: Secondary | ICD-10-CM | POA: Diagnosis not present

## 2023-12-26 DIAGNOSIS — M47812 Spondylosis without myelopathy or radiculopathy, cervical region: Secondary | ICD-10-CM | POA: Diagnosis not present

## 2023-12-26 DIAGNOSIS — R202 Paresthesia of skin: Secondary | ICD-10-CM | POA: Diagnosis not present

## 2023-12-26 DIAGNOSIS — M542 Cervicalgia: Secondary | ICD-10-CM | POA: Diagnosis not present

## 2023-12-26 DIAGNOSIS — M7918 Myalgia, other site: Secondary | ICD-10-CM | POA: Diagnosis not present

## 2024-01-21 DIAGNOSIS — M7918 Myalgia, other site: Secondary | ICD-10-CM | POA: Diagnosis not present

## 2024-01-21 DIAGNOSIS — M47812 Spondylosis without myelopathy or radiculopathy, cervical region: Secondary | ICD-10-CM | POA: Diagnosis not present

## 2024-01-21 DIAGNOSIS — M542 Cervicalgia: Secondary | ICD-10-CM | POA: Diagnosis not present

## 2024-01-21 DIAGNOSIS — M47816 Spondylosis without myelopathy or radiculopathy, lumbar region: Secondary | ICD-10-CM | POA: Diagnosis not present

## 2024-01-30 DIAGNOSIS — M47812 Spondylosis without myelopathy or radiculopathy, cervical region: Secondary | ICD-10-CM | POA: Diagnosis not present

## 2024-01-30 DIAGNOSIS — M47816 Spondylosis without myelopathy or radiculopathy, lumbar region: Secondary | ICD-10-CM | POA: Diagnosis not present

## 2024-01-30 DIAGNOSIS — M542 Cervicalgia: Secondary | ICD-10-CM | POA: Diagnosis not present

## 2024-01-30 DIAGNOSIS — M7918 Myalgia, other site: Secondary | ICD-10-CM | POA: Diagnosis not present

## 2024-02-01 DIAGNOSIS — M542 Cervicalgia: Secondary | ICD-10-CM | POA: Diagnosis not present

## 2024-02-06 DIAGNOSIS — M542 Cervicalgia: Secondary | ICD-10-CM | POA: Diagnosis not present

## 2024-02-08 DIAGNOSIS — M542 Cervicalgia: Secondary | ICD-10-CM | POA: Diagnosis not present

## 2024-02-08 DIAGNOSIS — M47812 Spondylosis without myelopathy or radiculopathy, cervical region: Secondary | ICD-10-CM | POA: Diagnosis not present

## 2024-02-08 DIAGNOSIS — M7918 Myalgia, other site: Secondary | ICD-10-CM | POA: Diagnosis not present

## 2024-02-08 DIAGNOSIS — M47816 Spondylosis without myelopathy or radiculopathy, lumbar region: Secondary | ICD-10-CM | POA: Diagnosis not present

## 2024-02-13 DIAGNOSIS — M542 Cervicalgia: Secondary | ICD-10-CM | POA: Diagnosis not present

## 2024-02-15 DIAGNOSIS — M542 Cervicalgia: Secondary | ICD-10-CM | POA: Diagnosis not present

## 2024-02-20 DIAGNOSIS — M542 Cervicalgia: Secondary | ICD-10-CM | POA: Diagnosis not present

## 2024-02-22 DIAGNOSIS — M542 Cervicalgia: Secondary | ICD-10-CM | POA: Diagnosis not present

## 2024-02-28 DIAGNOSIS — M542 Cervicalgia: Secondary | ICD-10-CM | POA: Diagnosis not present

## 2024-03-14 DIAGNOSIS — M542 Cervicalgia: Secondary | ICD-10-CM | POA: Diagnosis not present

## 2024-03-21 DIAGNOSIS — M542 Cervicalgia: Secondary | ICD-10-CM | POA: Diagnosis not present

## 2024-04-02 DIAGNOSIS — R202 Paresthesia of skin: Secondary | ICD-10-CM | POA: Diagnosis not present

## 2024-04-02 DIAGNOSIS — R200 Anesthesia of skin: Secondary | ICD-10-CM | POA: Diagnosis not present

## 2024-04-07 DIAGNOSIS — M542 Cervicalgia: Secondary | ICD-10-CM | POA: Diagnosis not present

## 2024-04-22 DIAGNOSIS — H526 Other disorders of refraction: Secondary | ICD-10-CM | POA: Diagnosis not present

## 2024-04-22 DIAGNOSIS — H43813 Vitreous degeneration, bilateral: Secondary | ICD-10-CM | POA: Diagnosis not present

## 2024-04-22 DIAGNOSIS — H35413 Lattice degeneration of retina, bilateral: Secondary | ICD-10-CM | POA: Diagnosis not present

## 2024-04-22 DIAGNOSIS — H40013 Open angle with borderline findings, low risk, bilateral: Secondary | ICD-10-CM | POA: Diagnosis not present

## 2024-04-22 DIAGNOSIS — H16223 Keratoconjunctivitis sicca, not specified as Sjogren's, bilateral: Secondary | ICD-10-CM | POA: Diagnosis not present

## 2024-05-29 ENCOUNTER — Other Ambulatory Visit: Payer: Self-pay

## 2024-05-29 ENCOUNTER — Ambulatory Visit

## 2024-05-29 ENCOUNTER — Ambulatory Visit
Admission: RE | Admit: 2024-05-29 | Discharge: 2024-05-29 | Disposition: A | Discharge status: Home / Self Care | Attending: Family Medicine | Admitting: Family Medicine

## 2024-05-29 ENCOUNTER — Ambulatory Visit: Payer: Self-pay | Admitting: *Deleted

## 2024-05-29 VITALS — BP 157/85 | HR 72 | Temp 98.4°F | Resp 16

## 2024-05-29 DIAGNOSIS — R109 Unspecified abdominal pain: Secondary | ICD-10-CM | POA: Diagnosis not present

## 2024-05-29 DIAGNOSIS — I878 Other specified disorders of veins: Secondary | ICD-10-CM | POA: Diagnosis not present

## 2024-05-29 DIAGNOSIS — N200 Calculus of kidney: Secondary | ICD-10-CM | POA: Diagnosis not present

## 2024-05-29 HISTORY — DX: Psoriasis, unspecified: L40.9

## 2024-05-29 LAB — POCT URINALYSIS DIP (MANUAL ENTRY)
Bilirubin, UA: NEGATIVE
Glucose, UA: NEGATIVE mg/dL
Ketones, POC UA: NEGATIVE mg/dL
Leukocytes, UA: NEGATIVE
Nitrite, UA: NEGATIVE
Protein Ur, POC: NEGATIVE mg/dL
Spec Grav, UA: 1.02 (ref 1.010–1.025)
Urobilinogen, UA: 0.2 U/dL
pH, UA: 5.5 (ref 5.0–8.0)

## 2024-05-29 MED ORDER — KETOROLAC TROMETHAMINE 30 MG/ML IJ SOLN
30.0000 mg | Freq: Once | INTRAMUSCULAR | Status: AC
  Administered 2024-05-29: 30 mg via INTRAMUSCULAR

## 2024-05-29 MED ORDER — TAMSULOSIN HCL 0.4 MG PO CAPS: 0.4000 mg | ORAL_CAPSULE | Freq: Every day | ORAL | 1 refills | Status: DC

## 2024-05-29 MED ORDER — OXYCODONE HCL 5 MG PO TABS: 5.0000 mg | ORAL_TABLET | ORAL | 0 refills | Status: DC | PRN

## 2024-05-29 MED ORDER — IBUPROFEN 800 MG PO TABS: 800.0000 mg | ORAL_TABLET | Freq: Three times a day (TID) | ORAL | 0 refills | Status: AC

## 2024-05-29 NOTE — ED Provider Notes (Signed)
 [facility]    CSN: [id number] Arrival date & time: 05/29/24  1153      History   Chief Complaint Chief Complaint  Patient presents with   Flank Pain    HPI Oscar Larson is a 53 y.o. male.   Patient has a long history of kidney stones.  He had a CT done in June 2024.  He had bilateral nonobstructive stones remaining.  He has not seen his urologist since that time.  He has been asymptomatic since then.  Unfortunately, he has developed pain in his left flank that is radiating around to his lower abdomen and he feels like it is a kidney stone.  He has taken some ibuprofen  without help.  He also has known back and neck pain but this feels different.  No nausea or no fever or chills    Past Medical History:  Diagnosis Date   Kidney stone    Psoriasis    Syncope     Patient Active Problem List   Diagnosis Date Noted   Nephrolithiasis 05/02/2023    Past Surgical History:  Procedure Laterality Date   EYE SURGERY     KIDNEY STONE SURGERY     LITHOTRIPSY         Home Medications    Prior to Admission medications   Medication Sig Start Date End Date Taking? Authorizing Provider  ibuprofen  (ADVIL ) 800 MG tablet Take 1 tablet (800 mg total) by mouth 3 (three) times daily. 05/29/24  Yes Maranda Jamee Jacob, MD  oxyCODONE  (ROXICODONE ) 5 MG immediate release tablet Take 1 tablet (5 mg total) by mouth every 4 (four) hours as needed for severe pain (pain score 7-10). 05/29/24  Yes Maranda Jamee Jacob, MD  tamsulosin  (FLOMAX ) 0.4 MG CAPS capsule Take 1 capsule (0.4 mg total) by mouth daily after supper. 05/29/24  Yes Maranda Jamee Jacob, MD  allopurinol (ZYLOPRIM) 100 MG tablet Take by mouth. 09/20/22   [provider]    Family History History reviewed. No pertinent family history.  Social History Social History   Tobacco Use   Smoking status: Never   Smokeless tobacco: Never  Substance Use Topics   Alcohol use: No   Drug use: No      Allergies   Patient has no known allergies.   Review of Systems Review of Systems See HPI  Physical Exam Triage Vital Signs ED Triage Vitals  Encounter Vitals Group     BP 05/29/24 1203 (!) 157/85     Girls Systolic BP Percentile --      Girls Diastolic BP Percentile --      Boys Systolic BP Percentile --      Boys Diastolic BP Percentile --      Pulse Rate 05/29/24 1203 72     Resp 05/29/24 1203 16     Temp 05/29/24 1203 98.4 F (36.9 C)     Temp src --      SpO2 05/29/24 1203 97 %     Weight --      Height --      Head Circumference --      Peak Flow --      Pain Score 05/29/24 1207 4     Pain Loc --      Pain Education --      Exclude from Growth Chart --    No data found.  Updated Vital Signs BP (!) 157/85   Pulse 72   Temp 98.4 F (36.9 C)  Resp 16   SpO2 97%      Physical Exam Constitutional:      General: He is not in acute distress.    Appearance: He is well-developed.  HENT:     Head: Normocephalic and atraumatic.  Eyes:     Conjunctiva/sclera: Conjunctivae normal.     Pupils: Pupils are equal, round, and reactive to light.  Cardiovascular:     Rate and Rhythm: Normal rate.  Pulmonary:     Effort: Pulmonary effort is normal. No respiratory distress.  Abdominal:     General: There is no distension.     Palpations: Abdomen is soft.     Comments: Moderately protuberant abdomen.  Nontender.  There is tenderness in the left CVA region to palpation/percussion  Musculoskeletal:        General: Normal range of motion.     Cervical back: Normal range of motion.  Skin:    General: Skin is warm and dry.  Neurological:     Mental Status: He is alert.      UC Treatments / Results  Labs (all labs ordered are listed, but only abnormal results are displayed) Labs Reviewed  POCT URINALYSIS DIP (MANUAL ENTRY) - Abnormal; Notable for the following components:      Result Value   Blood, UA trace-intact (*)    All other components within  normal limits    EKG   Radiology US  Renal Result Date: 05/29/2024 CLINICAL DATA:  History of nephrolithiasis with 1-2 day history of left flank pain and recent dysuria EXAM: RENAL / URINARY TRACT ULTRASOUND COMPLETE COMPARISON:  None Available. FINDINGS: Right Kidney: Length = 13.4 cm AP renal pelvis diameter = <10 mm Normal parenchymal echogenicity with preserved corticomedullary differentiation. No urinary tract dilation or shadowing calculi. Extrarenal pelvis. The ureter is not seen. Left Kidney: Length = 13.2 cm AP renal pelvis diameter = <10 mm Normal parenchymal echogenicity with preserved corticomedullary differentiation. Nonshadowing interpolar stone measures 5 mm. No urinary tract dilation or shadowing calculi. Extrarenal pelvis. The ureter is not seen. Bladder: Appears normal for degree of bladder distention. Other: None. IMPRESSION: 1. Nonobstructing 5 mm left renal stone. No hydronephrosis. 2. Bilateral extrarenal pelves. Electronically Signed   By: Limin  Xu M.D.   On: 05/29/2024 14:04   DG Abdomen 1 View Result Date: 05/29/2024 CLINICAL DATA:  Acute left flank pain. EXAM: ABDOMEN - 1 VIEW COMPARISON:  CT scan of May 17, 2023. FINDINGS: The bowel gas pattern is normal. Small left renal calculus is noted in lower pole. Phlebolith is noted in the pelvis. IMPRESSION: Small nonobstructive left renal calculus. Electronically Signed   By: Lynwood Landy Raddle M.D.   On: 05/29/2024 13:28    Procedures Procedures (including critical care time)  Medications Ordered in UC Medications  ketorolac  (TORADOL ) 30 MG/ML injection 30 mg (30 mg Intramuscular Given 05/29/24 1245)    Initial Impression / Assessment and Plan / UC Course  I have reviewed the triage vital signs and the nursing notes.  Pertinent labs & imaging results that were available during my care of the patient were reviewed by me and considered in my medical decision making (see chart for details).     I was able to communicate  with Dr. Roseann through epic email.  He is aware of patient's diagnosis and treatment.  He will see him in follow-up Patient does report improvement of his pain from the Toradol  injection. Patient is informed that he has a nonobstructive stone Final Clinical Impressions(s) /  UC Diagnoses   Final diagnoses:  Nephrolithiasis  Kidney stone on left side     Discharge Instructions      The stone is not blocking your kidney Continue to drink lots of water Take ibuprofen  3 times a day for pain Take tamsulosin  once a day to help relax urinary tract and help stone pass more easily I have prescribed oxycodone  to take if pain is severe.  Do not take oxycodone  and drive We will provide a strainer so that you can capture this stone You need to call Dr. Roseann to make an appointment to be seen in follow-up If you become much worse with severe pain or vomiting you must go to the emergency room     ED Prescriptions     Medication Sig Dispense Auth. Provider   tamsulosin  (FLOMAX ) 0.4 MG CAPS capsule Take 1 capsule (0.4 mg total) by mouth daily after supper. 30 capsule Maranda Jamee Jacob, MD   ibuprofen  (ADVIL ) 800 MG tablet Take 1 tablet (800 mg total) by mouth 3 (three) times daily. 21 tablet Maranda Jamee Jacob, MD   oxyCODONE  (ROXICODONE ) 5 MG immediate release tablet Take 1 tablet (5 mg total) by mouth every 4 (four) hours as needed for severe pain (pain score 7-10). 10 tablet Maranda Jamee Jacob, MD      I have reviewed the PDMP during this encounter.   Maranda Jamee Jacob, MD 05/29/24 [phone]

## 2024-05-29 NOTE — ED Triage Notes (Addendum)
 Left flank pain since day before yesterday. Pain worse since yesterday. Pain worse with movement, sneezing. 2-3 days before the pain, noticed burning urination. No hematuria. Has hx of kidney stone last year. Feels similar. Has had tylenol.

## 2024-05-29 NOTE — Telephone Encounter (Signed)
 FYI Only or Action Required?: FYI only for provider.  Patient was last seen in primary care on 09/26/23.  Called Nurse Triage reporting Back Pain.  Symptoms began several days ago.  Interventions attempted: OTC medications: tylenol.  Symptoms are: gradually worsening.  Triage Disposition: See Physician Within 24 Hours  Patient/caregiver understands and will follow disposition?: yes   Reason for Disposition  MODERATE pain (e.Larson., interferes with normal activities or awakens from sleep)  Answer Assessment - Initial Assessment Questions 1. ONSET: When did the pain begin? (e.Larson., minutes, hours, days)    3 days 2. LOCATION: Where does it hurt? (upper, mid or lower back)      Left side- flank 3. SEVERITY: How bad is the pain?  (e.Larson., Scale 1-10; mild, moderate, or severe)     Bending makes pain worse 4. PATTERN: Is the pain constant? (e.Larson., yes, no; constant, intermittent)      Intermittent pain- worse with movement 5. RADIATION: Does the pain shoot into your legs or somewhere else?     no 6. CAUSE:  What do you think is causing the back pain?      Unsure- kidney stone 7. BACK OVERUSE:  Any recent lifting of heavy objects, strenuous work or exercise?     no 8. MEDICINES: What have you taken so far for the pain? (e.Larson., nothing, acetaminophen, NSAIDS)     tylenol 9. NEUROLOGIC SYMPTOMS: Do you have any weakness, numbness, or problems with bowel/bladder control?     no 10. OTHER SYMPTOMS: Do you have any other symptoms? (e.Larson., fever, abdomen pain, burning with urination, blood in urine)       Urinary symptoms  Protocols used: Back Pain-A-AH, Flank Pain-A-AH    Copied from CRM [id number]. Topic: Clinical - Red Word Triage >> May 29, 2024  9:07 AM Oscar Larson wrote: [facility] that prompted transfer to Nurse Triage: struggling with extreme back / bending over

## 2024-05-29 NOTE — Discharge Instructions (Signed)
 The stone is not blocking your kidney Continue to drink lots of water Take ibuprofen  3 times a day for pain Take tamsulosin  once a day to help relax urinary tract and help stone pass more easily I have prescribed oxycodone  to take if pain is severe.  Do not take oxycodone  and drive We will provide a strainer so that you can capture this stone You need to call Dr. Roseann to make an appointment to be seen in follow-up If you become much worse with severe pain or vomiting you must go to the emergency room

## 2024-06-05 ENCOUNTER — Ambulatory Visit: Admitting: Urology

## 2024-06-06 ENCOUNTER — Ambulatory Visit: Admitting: Urology

## 2024-06-06 ENCOUNTER — Encounter: Payer: Self-pay | Admitting: Urology

## 2024-06-06 VITALS — BP 138/91 | HR 60 | Ht 66.0 in | Wt 220.0 lb

## 2024-06-06 DIAGNOSIS — N200 Calculus of kidney: Secondary | ICD-10-CM | POA: Diagnosis not present

## 2024-06-06 LAB — MICROSCOPIC EXAMINATION

## 2024-06-06 LAB — URINALYSIS, ROUTINE W REFLEX MICROSCOPIC
Bilirubin, UA: NEGATIVE
Glucose, UA: NEGATIVE
Ketones, UA: NEGATIVE
Leukocytes,UA: NEGATIVE
Nitrite, UA: NEGATIVE
Protein,UA: NEGATIVE
Specific Gravity, UA: 1.015 (ref 1.005–1.030)
Urobilinogen, Ur: 0.2 mg/dL (ref 0.2–1.0)
pH, UA: 6.5 (ref 5.0–7.5)

## 2024-06-06 NOTE — Progress Notes (Signed)
 Assessment: 1. Nephrolithiasis     Plan: I personally reviewed the patient's chart including provider notes, lab and imaging results. I personally reviewed the renal ultrasound and KUB from 05/29/2024. He has a stable calculus in the left kidney.  There is no evidence of obstruction. I do not think that his back pain is urologic in nature as there is no evidence of obstruction.  This is likely musculoskeletal. Recommend further evaluation with his PCP. Continue stone prevention.  Chief Complaint:  Chief Complaint  Patient presents with   Nephrolithiasis    History of Present Illness:  Oscar Larson is a 53 y.o. male who is seen for continued evaluation of nephrolithiasis.   He was previously seen in 2017 for a left ureteral calculus.  CT imaging at that time showed a 6 x 10 mm calculus in the left proximal ureter with associated hydronephrosis and a 4 mm nonobstructing right upper pole renal calculus.  He underwent left ESL in October 2016 passing stone fragments following the procedure.  Follow-up KUB showed fragments in the proximal left ureter and a possible fragment in the left distal ureter.  A repeat ESL was performed in December 2016.  He again passed small fragments following that treatment.  Stone analysis showed calcium oxalate 95% and calcium phosphate 5%.  He had persistence of fragments in the ureter and subsequently underwent left ureteroscopy with holmium laser lithotripsy in 1/17.  He was last seen in March 2017.  He was seen in June 2020 for with a 79-month history of left flank pain, dysuria, urgency, and intermittent gross hematuria.  No nausea or vomiting.  He reported passing a stone prior to his visit.   Stone analysis: 85% calcium oxalate monohydrate, 8% calcium oxalate dihydrate, 7% calcium phosphate  CT renal stone study from 05/17/2023 showed bilateral nonobstructive renal stones measuring 3 mm, no ureteral calculi or obstruction.  He was recently  seen for evaluation of left-sided flank pain and dysuria. Renal ultrasound from 05/29/2024 showed a nonobstructing 5 mm left renal stone. KUB from 05/29/2024 showed a small nonobstructive left renal calculus.  He presents today for follow-up.  He continues to report pain in his left lower back area.  This is increased with movement.  He reports that it has improved in the past week.  He is taking ibuprofen .  He is not having any lower urinary tract symptoms.  Portions of the above documentation were copied from a prior visit for review purposes only.   Past Medical History:  Past Medical History:  Diagnosis Date   Kidney stone    Psoriasis    Syncope     Past Surgical History:  Past Surgical History:  Procedure Laterality Date   EYE SURGERY     KIDNEY STONE SURGERY     LITHOTRIPSY      Allergies:  No Known Allergies  Family History:  No family history on file.  Social History:  Social History   Tobacco Use   Smoking status: Never   Smokeless tobacco: Never  Substance Use Topics   Alcohol use: No   Drug use: No    ROS: Constitutional:  Negative for fever, chills, weight loss CV: Negative for chest pain, previous MI, hypertension Respiratory:  Negative for shortness of breath, wheezing, sleep apnea, frequent cough GI:  Negative for nausea, vomiting, bloody stool, GERD  Physical exam: BP (!) 138/91   Pulse 60   Ht 5' 6 (1.676 m)   Wt 220 lb (99.8 kg)  BMI 35.51 kg/m  GENERAL APPEARANCE:  Well appearing, well developed, well nourished, NAD HEENT:  Atraumatic, normocephalic, oropharynx clear NECK:  Supple without lymphadenopathy or thyromegaly ABDOMEN:  Soft, non-tender, no masses EXTREMITIES:  Moves all extremities well, without clubbing, cyanosis, or edema NEUROLOGIC:  Alert and oriented x 3, normal gait, CN II-XII grossly intact MENTAL STATUS:  appropriate BACK:  Non-tender to palpation, No CVAT SKIN:  Warm, dry, and intact  Results: U/A:  0-5 WBC, 0-2  RBC

## 2024-06-26 DIAGNOSIS — K219 Gastro-esophageal reflux disease without esophagitis: Secondary | ICD-10-CM | POA: Insufficient documentation

## 2024-06-26 DIAGNOSIS — D649 Anemia, unspecified: Secondary | ICD-10-CM | POA: Insufficient documentation

## 2024-06-27 ENCOUNTER — Encounter: Payer: Self-pay | Admitting: Family Medicine

## 2024-06-27 ENCOUNTER — Ambulatory Visit (INDEPENDENT_AMBULATORY_CARE_PROVIDER_SITE_OTHER): Admitting: Family Medicine

## 2024-06-27 VITALS — BP 132/88 | HR 67 | Temp 98.0°F | Ht 66.0 in | Wt 226.5 lb

## 2024-06-27 DIAGNOSIS — M109 Gout, unspecified: Secondary | ICD-10-CM

## 2024-06-27 DIAGNOSIS — M5416 Radiculopathy, lumbar region: Secondary | ICD-10-CM

## 2024-06-27 DIAGNOSIS — N200 Calculus of kidney: Secondary | ICD-10-CM | POA: Diagnosis not present

## 2024-06-27 DIAGNOSIS — Z7689 Persons encountering health services in other specified circumstances: Secondary | ICD-10-CM

## 2024-06-27 DIAGNOSIS — R202 Paresthesia of skin: Secondary | ICD-10-CM

## 2024-06-27 DIAGNOSIS — R200 Anesthesia of skin: Secondary | ICD-10-CM

## 2024-06-27 MED ORDER — GABAPENTIN 100 MG PO CAPS: 100.0000 mg | ORAL_CAPSULE | Freq: Every day | ORAL | 1 refills | Status: DC

## 2024-06-27 MED ORDER — COLCHICINE 0.6 MG PO TABS: 0.6000 mg | ORAL_TABLET | Freq: Two times a day (BID) | ORAL | 5 refills | Status: AC

## 2024-06-27 MED ORDER — ALLOPURINOL 100 MG PO TABS: 100.0000 mg | ORAL_TABLET | Freq: Every day | ORAL | 3 refills | Status: AC

## 2024-06-27 NOTE — Addendum Note (Signed)
 Addended by: ERIKA ELIDA RAMAN on: 06/27/2024 02:55 PM   Modules accepted: Orders

## 2024-06-27 NOTE — Progress Notes (Signed)
 Patient Office Visit  Assessment & Plan:  Lumbar radicular pain -     Ambulatory referral to Orthopedic Surgery -     Gabapentin ; Take 1 capsule (100 mg total) by mouth at bedtime. Start with one and increase up to 3 at night time  Dispense: 90 capsule; Refill: 1  Nephrolithiasis  Numbness and tingling of leg -     Ambulatory referral to Orthopedic Surgery -     Gabapentin ; Take 1 capsule (100 mg total) by mouth at bedtime. Start with one and increase up to 3 at night time  Dispense: 90 capsule; Refill: 1  Encounter to establish care -     CBC with Differential/Platelet -     Comprehensive metabolic panel with GFR -     Lipid panel -     Uric acid  Gout, unspecified cause, unspecified chronicity, unspecified site -     Allopurinol ; Take 1 tablet (100 mg total) by mouth daily.  Dispense: 90 tablet; Refill: 3 -     Colchicine ; Take 1 tablet (0.6 mg total) by mouth 2 (two) times daily.  Dispense: 60 tablet; Refill: 5   Assessment and Plan    Chronic low back pain with numbness left side Chronic low back pain with numbness since 2016, recently worsened. Previous studies ruled out neuropathy or radiculopathy. Possible nerve irritation or bone-related issues. Neurology and orthopedics considered for management. - Refer to [facility] in [place] for evaluation and management. - Prescribe gabapentin , starting at 100 mg at night, titrate up to 300 mg as needed. Discussed potential increase to 2400 mg per day (max dosage). patient aware he needs to be consistent with the Gabapentin   Gout Chronic gout with monthly flare-ups. Allopurinol  prescribed. Dietary factors may contribute to flare-ups. - Refill colchicine  for acute flare management. - Continue allopurinol  for chronic management. - Discussed dietary modifications to prevent attacks.  Nephrolithiasis (kidney stones) Kidney stones confirmed by imaging, no obstruction. Pain not attributed to stones by urologist.  Muscle  cramps Frequent cramps, possibly due to inadequate hydration. Urologist recommended increased water intake. - Encourage water intake of at least 2 liters per day.  Overweight Recent weight gain of 9-10 pounds due to dietary habits. Previous weight loss of 20 pounds noted. - Encourage dietary modifications to manage weight.      Test results were reviewed and analyzed as part of the medical decision making of this visit.  Reviewed previous notes from [facility], [facility] notes and laboratory during the office visit.  [facility] consult ordered.  Start with gabapentin  100 mg at night and increase up to 300 mg nightly.  Patient is aware that it can he can increase dosage if necessary Recommend healthy diet i.e mediterranean/DASH diet, consistent exercise - 30 minutes 5 day per week, and gradual weight loss. Patient declines getting pneumococcal vaccine and/or Shingrix vaccine today.    Return if symptoms worsen or fail to improve, for physical.   Subjective:    Patient ID: Oscar Larson, male    DOB: Aug 03, 1971  Age: 53 y.o. MRN: [id number]  No chief complaint on file.   HPI Discussed the use of AI scribe software for clinical note transcription with the patient, who gave verbal consent to proceed.  History of Present Illness        Oscar Larson is a 53 year old male with chronic left sided back pain and kidney stones, gout who presents with severe back pain, and establish care here.  He has experienced chronic back pain since at least 2016, with recent episodes becoming severe, rated more than 6 out of 10, sometimes reaching 7 or 8. The pain is not related to coughing and worsens with physical activity. He has been prescribed gabapentin  and oxycodone  for pain management but has not taken gabapentin  recently. He also uses ibuprofen  800 mg as needed. He has undergone various diagnostic tests, including nerve conduction studies which were  inconclusive and a CT scan years ago, saw NP at [facility] and saw someone in [place] years ago. He has seen multiple specialists, including neurologists but does not think he saw Ortho  He has a history of kidney stones, with a recent episode where he thought he was experiencing a kidney stone due to severe back pain. A visit to the emergency room revealed urine test abnormalities, and an ultrasound confirmed the presence of kidney stones without blockage. He has previously passed a stone and had a CT scan showing stones in both kidneys. He saw Dr. Bethena Novak in [place] who did not think his back pain was related to kidney stones  He experiences gout with episodes approximately once a month, managed with colchicine  and allopurinol . He reports muscle cramping and acknowledges not consistently drinking the recommended two liters of water per day, which may contribute to his symptoms.  He recently hosted a large family gathering for his daughter's high school graduation, involving significant physical activity and stress, which may have exacerbated his symptoms. He has gained some weight recently, attributing it to the event. Physical Exam  Results RADIOLOGY Kidney ultrasound: Kidney stone present, no blockage  DIAGNOSTIC Nerve conduction study: No neuropathy, no radiculopathy- no conclusion Assessment & Plan Chronic low back pain with numbness left side Chronic low back pain with numbness since 2016, recently worsened. Previous studies ruled out neuropathy or radiculopathy. Possible nerve irritation or bone-related issues. Neurology and orthopedics considered for management. - Refer to [facility] in [place] for evaluation and management. - Prescribe gabapentin , starting at 100 mg at night, titrate up to 300 mg as needed. Discussed potential increase to 2400 mg per day (max dosage). patient aware he needs to be consistent with the Gabapentin   Gout Chronic gout with monthly flare-ups.  Allopurinol  prescribed. Dietary factors may contribute to flare-ups. - Refill colchicine  for acute flare management. - Continue allopurinol  for chronic management. - Discussed dietary modifications to prevent attacks.  Nephrolithiasis (kidney stones) Kidney stones confirmed by imaging, no obstruction. Pain not attributed to stones by urologist.  Muscle cramps Frequent cramps, possibly due to inadequate hydration. Urologist recommended increased water intake. - Encourage water intake of at least 2 liters per day.  Overweight Recent weight gain of 9-10 pounds due to dietary habits. Previous weight loss of 20 pounds noted. - Encourage dietary modifications to manage weight.    The 10-year ASCVD risk score (Arnett DK, et al., 2019) is: 5.2%  Past Medical History:  Diagnosis Date   Gout    Hyperlipidemia    Hypertriglyceridemia    Kidney stone    Psoriasis    Syncope    Past Surgical History:  Procedure Laterality Date   EYE SURGERY     LITHOTRIPSY     Social History   Tobacco Use   Smoking status: Never   Smokeless tobacco: Never  Substance Use Topics   Alcohol use: No   Drug use: No   Family History  Problem Relation Age of Onset   Colon cancer Mother    Breast  cancer Mother    Hypertension Mother    Hemolytic uremic syndrome Father    No Known Allergies  ROS    Objective:    BP 132/88   Pulse 67   Temp 98 F (36.7 C)   Ht 5' 6 (1.676 m)   Wt 226 lb 8 oz (102.7 kg)   SpO2 97%   BMI 36.56 kg/m  BP Readings from Last 3 Encounters:  06/27/24 132/88  06/06/24 (!) 138/91  05/29/24 (!) 157/85   Wt Readings from Last 3 Encounters:  06/27/24 226 lb 8 oz (102.7 kg)  06/06/24 220 lb (99.8 kg)  05/02/23 220 lb (99.8 kg)    Physical Exam Vitals and nursing note reviewed.  Constitutional:      Appearance: Normal appearance.  HENT:     Head: Normocephalic.     Right Ear: Tympanic membrane, ear canal and external ear normal.     Left Ear: Tympanic  membrane, ear canal and external ear normal.  Eyes:     Extraocular Movements: Extraocular movements intact.     Conjunctiva/sclera: Conjunctivae normal.     Pupils: Pupils are equal, round, and reactive to light.  Cardiovascular:     Rate and Rhythm: Normal rate and regular rhythm.     Heart sounds: Normal heart sounds.  Pulmonary:     Effort: Pulmonary effort is normal.     Breath sounds: Normal breath sounds.  Abdominal:     Tenderness: There is no abdominal tenderness.  Musculoskeletal:     Lumbar back: Tenderness and bony tenderness present. Decreased range of motion. Negative right straight leg raise test and negative left straight leg raise test. No scoliosis.     Right lower leg: No edema.     Left lower leg: No edema.     Comments: Back- decreased ROM with forward flexion. Can get on exam table without assistance.   Neurological:     General: No focal deficit present.     Mental Status: He is alert and oriented to person, place, and time.  Psychiatric:        Mood and Affect: Mood normal.        Behavior: Behavior normal.      No results found for any visits on 06/27/24.

## 2024-07-28 DIAGNOSIS — J069 Acute upper respiratory infection, unspecified: Secondary | ICD-10-CM | POA: Diagnosis not present

## 2024-08-23 ENCOUNTER — Other Ambulatory Visit: Payer: Self-pay | Admitting: Family Medicine

## 2024-08-23 DIAGNOSIS — R200 Anesthesia of skin: Secondary | ICD-10-CM

## 2024-08-23 DIAGNOSIS — M5416 Radiculopathy, lumbar region: Secondary | ICD-10-CM

## 2024-08-25 NOTE — Telephone Encounter (Signed)
 Requested medication (s) are due for refill today: yes  Requested medication (s) are on the active medication list: yes  Last refill:  06/27/24 #90 1 RF  Future visit scheduled: yes  Notes to clinic:  Cr overdue   Requested Prescriptions  Pending Prescriptions Disp Refills   gabapentin  (NEURONTIN ) 100 MG capsule [Pharmacy Med Name: GABAPENTIN  100 MG CAPSULE] 90 capsule 1    Sig: Take 1 capsule (100 mg total) by mouth at bedtime. Start with one and increase up to 3 at night time     Neurology: Anticonvulsants - gabapentin  Failed - 08/25/2024  1:54 PM      Failed - Cr in normal range and within 360 days    Creatinine, Ser  Date Value Ref Range Status  02/15/2015 1.37 (H) 0.50 - 1.35 mg/dL Final         Failed - Completed PHQ-2 or PHQ-9 in the last 360 days      Passed - Valid encounter within last 12 months    Recent Outpatient Visits           1 month ago Lumbar radicular pain   [facility] Aletha Bene, MD

## 2024-09-23 DIAGNOSIS — G5712 Meralgia paresthetica, left lower limb: Secondary | ICD-10-CM | POA: Diagnosis not present

## 2024-09-29 ENCOUNTER — Ambulatory Visit: Admitting: Family Medicine

## 2024-09-29 ENCOUNTER — Encounter: Admitting: Family Medicine

## 2024-10-25 ENCOUNTER — Other Ambulatory Visit: Payer: Self-pay | Admitting: Family Medicine

## 2024-10-25 DIAGNOSIS — M5416 Radiculopathy, lumbar region: Secondary | ICD-10-CM

## 2024-10-25 DIAGNOSIS — R200 Anesthesia of skin: Secondary | ICD-10-CM
